# Patient Record
Sex: Male | Born: 1990 | Race: White | Hispanic: No | Marital: Single | Smoking: Current every day smoker
Health system: Southern US, Community
[De-identification: ages and names within clinical notes are randomized; demographics above are authoritative.]

## PROBLEM LIST (undated history)

## (undated) HISTORY — PX: TONSILLECTOMY: SUR1361

## (undated) HISTORY — PX: HAND SURGERY: SHX662

---

## 2016-12-06 ENCOUNTER — Emergency Department (INDEPENDENT_AMBULATORY_CARE_PROVIDER_SITE_OTHER): Payer: Self-pay

## 2016-12-06 ENCOUNTER — Encounter: Payer: Self-pay | Admitting: Emergency Medicine

## 2016-12-06 ENCOUNTER — Emergency Department (INDEPENDENT_AMBULATORY_CARE_PROVIDER_SITE_OTHER)
Admission: EM | Admit: 2016-12-06 | Discharge: 2016-12-06 | Disposition: A | Discharge status: Home / Self Care | Payer: Self-pay | Source: Home / Self Care | Attending: Family Medicine | Admitting: Family Medicine

## 2016-12-06 DIAGNOSIS — M79672 Pain in left foot: Secondary | ICD-10-CM

## 2016-12-06 DIAGNOSIS — S9032XA Contusion of left foot, initial encounter: Secondary | ICD-10-CM

## 2016-12-06 NOTE — ED Provider Notes (Signed)
[facility]    CSN: [id number] Arrival date & time: 12/06/16  1021     History   Chief Complaint Chief Complaint  Patient presents with  . Foot Injury    HPI Henry Jordan is a 26 y.o. male.   Patient bumped the lateral aspect of his left foot on a door jamb last night.  He has persistent pain this morning.   The history is provided by the patient.  Foot Injury  Location:  Foot Time since incident:  12 hours Injury: yes   Mechanism of injury comment:  Contusion Foot location:  L foot Pain details:    Quality:  Aching   Radiates to:  Does not radiate   Severity:  Mild   Onset quality:  Sudden   Duration:  12 hours   Timing:  Constant   Progression:  Unchanged Chronicity:  New Dislocation: no   Foreign body present:  No foreign bodies Prior injury to area:  No Relieved by:  None tried Worsened by:  Bearing weight Ineffective treatments:  NSAIDs Associated symptoms: stiffness and swelling   Associated symptoms: no decreased ROM, no numbness and no tingling   Risk factors: obesity     History reviewed. No pertinent past medical history.  There are no active problems to display for this patient.   Past Surgical History:  Procedure Laterality Date  . HAND SURGERY    . TONSILLECTOMY         Home Medications    Prior to Admission medications   Not on File    Family History History reviewed. No pertinent family history.  Social History Social History  Substance Use Topics  . Smoking status: Current Every Day Smoker  . Smokeless tobacco: Current User  . Alcohol use Yes     Allergies   Patient has no known allergies.   Review of Systems Review of Systems  Musculoskeletal: Positive for stiffness.  All other systems reviewed and are negative.    Physical Exam Triage Vital Signs ED Triage Vitals  Enc Vitals Group     BP 12/06/16 1103 118/71     Pulse Rate 12/06/16 1103 81     Resp --      Temp 12/06/16 1103 98.1 F (36.7  C)     Temp Source 12/06/16 1103 Oral     SpO2 12/06/16 1103 97 %     Weight 12/06/16 1104 (!) 325 lb (147.4 kg)     Height 12/06/16 1104  (1.753 m)     Head Circumference --      Peak Flow --      Pain Score 12/06/16 1104 5     Pain Loc --      Pain Edu? --      Excl. in GC? --    No data found.   Updated Vital Signs BP 118/71 (BP Location: Left Arm)   Pulse 81   Temp 98.1 F (36.7 C) (Oral)   Ht  (1.753 m)   Wt (!) 325 lb (147.4 kg)   SpO2 97%   BMI 47.99 kg/m   Visual Acuity Right Eye Distance:   Left Eye Distance:   Bilateral Distance:    Right Eye Near:   Left Eye Near:    Bilateral Near:     Physical Exam  Constitutional: He appears well-developed and well-nourished. No distress.  HENT:  Head: Atraumatic.  Eyes: Pupils are equal, round, and reactive to light.  Cardiovascular: Normal rate.  Pulmonary/Chest: Effort normal.  Musculoskeletal:       Left foot: There is tenderness, bony tenderness and swelling. There is normal range of motion, normal capillary refill, no crepitus, no deformity and no laceration.       Feet:  There is tenderness to palpation over the lateral aspect of the left foot (MTP joint and metatarsal) with mild swelling and ecchymosis over plantar surface.  All toes have full range of motion.  Distal neurovascular function is intact.   Neurological: He is alert.  Skin: Skin is warm and dry.  Nursing note and vitals reviewed.    UC Treatments / Results  Labs (all labs ordered are listed, but only abnormal results are displayed) Labs Reviewed - No data to display  EKG  EKG Interpretation None       Radiology Dg Foot Complete Left  Result Date: 12/06/2016 CLINICAL DATA:  Hit foot on door frame last night with pain laterally EXAM: LEFT FOOT - COMPLETE 3+ VIEW COMPARISON:  None. FINDINGS: Tarsal -metatarsal alignment is normal. No fracture is seen. Joint spaces appear normal. IMPRESSION: Negative. Electronically Signed    By: Dwyane Dee M.D.   On: 12/06/2016 11:10    Procedures Procedures (including critical care time)  Medications Ordered in UC Medications - No data to display   Initial Impression / Assessment and Plan / UC Course  I have reviewed the triage vital signs and the nursing notes.  Pertinent labs & imaging results that were available during my care of the patient were reviewed by me and considered in my medical decision making (see chart for details).    Ace wrap applied. Wear ace wrap until swelling resolves.  Apply ice pack for 20 to 30 minutes, 3 to 4 times daily  Continue until pain and swelling decrease.   Elevate foot.  May take Ibuprofen , 4 tabs every 8 hours with food.  Followup with Dr. Rodney Langton or Dr. Clementeen [facility] ([facility]) if not improving about two weeks.     Final Clinical Impressions(s) / UC Diagnoses   Final diagnoses:  Contusion of left foot, initial encounter    New Prescriptions New Prescriptions   No medications on file     Lattie Haw, MD 12/06/16 1146

## 2016-12-06 NOTE — ED Triage Notes (Signed)
Left foot injury yesterday, "stumped my toe", Top of foot hurts

## 2016-12-06 NOTE — Discharge Instructions (Signed)
Wear ace wrap until swelling resolves.  Apply ice pack for 20 to 30 minutes, 3 to 4 times daily  Continue until pain and swelling decrease.   Elevate foot.  May take Ibuprofen , 4 tabs every 8 hours with food.

## 2018-09-27 IMAGING — DX DG FOOT COMPLETE 3+V*L*
3 series · 3 of 3 positions shown · non-contrast
Comparison: None.

CLINICAL DATA: Hit foot on door frame last night with pain
laterally

EXAM:
LEFT FOOT - COMPLETE 3+ VIEW

[foot ap]
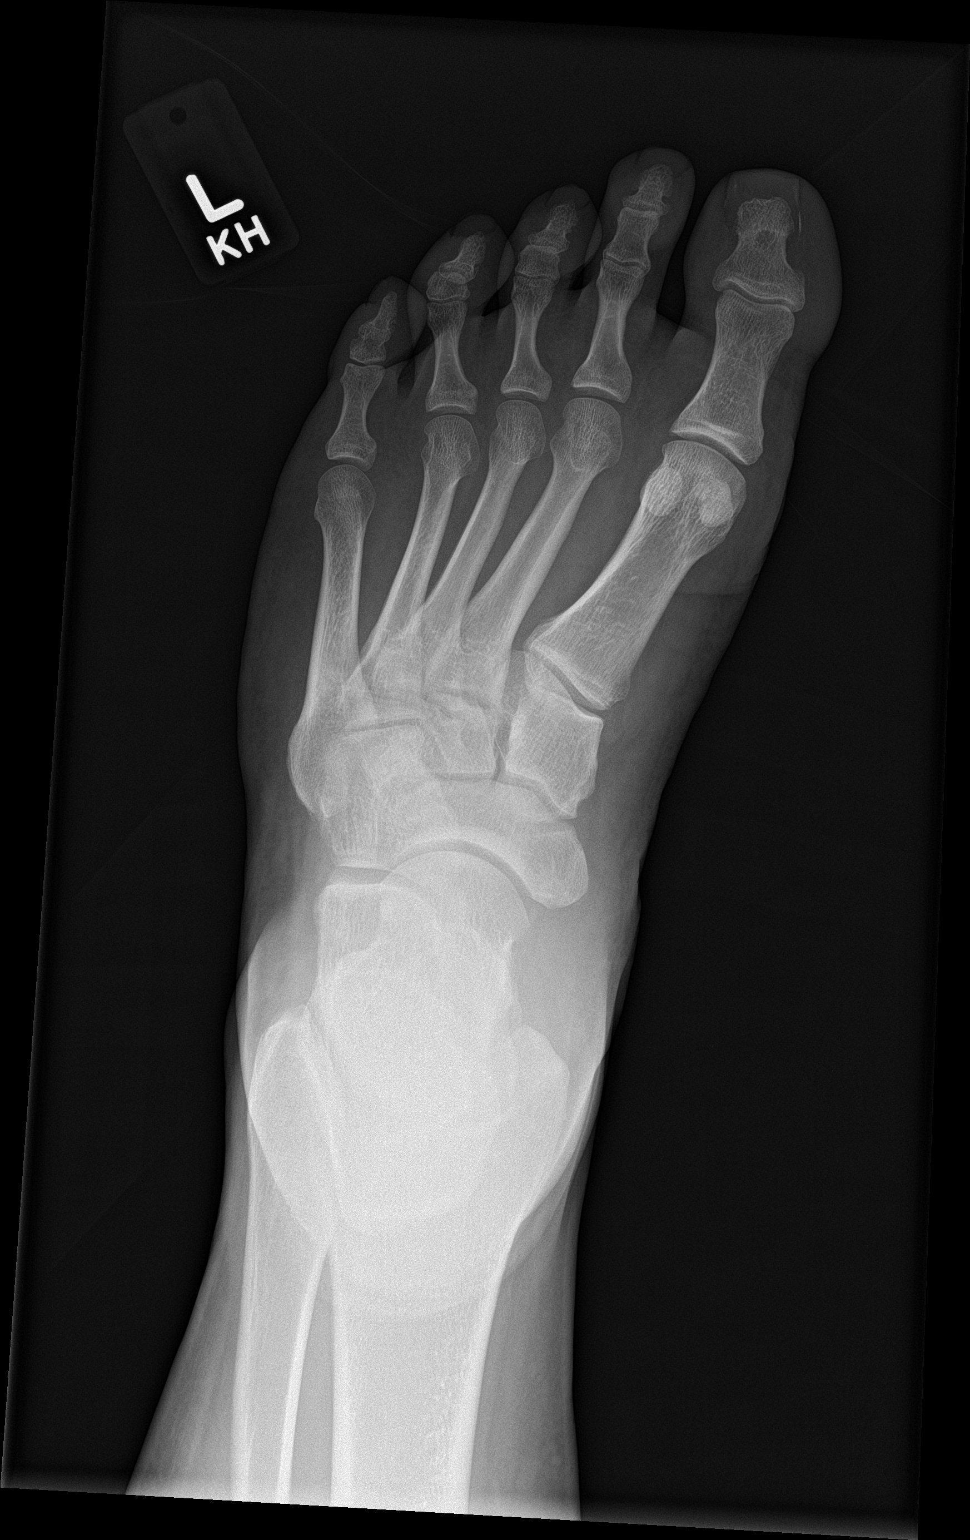

[foot obl]
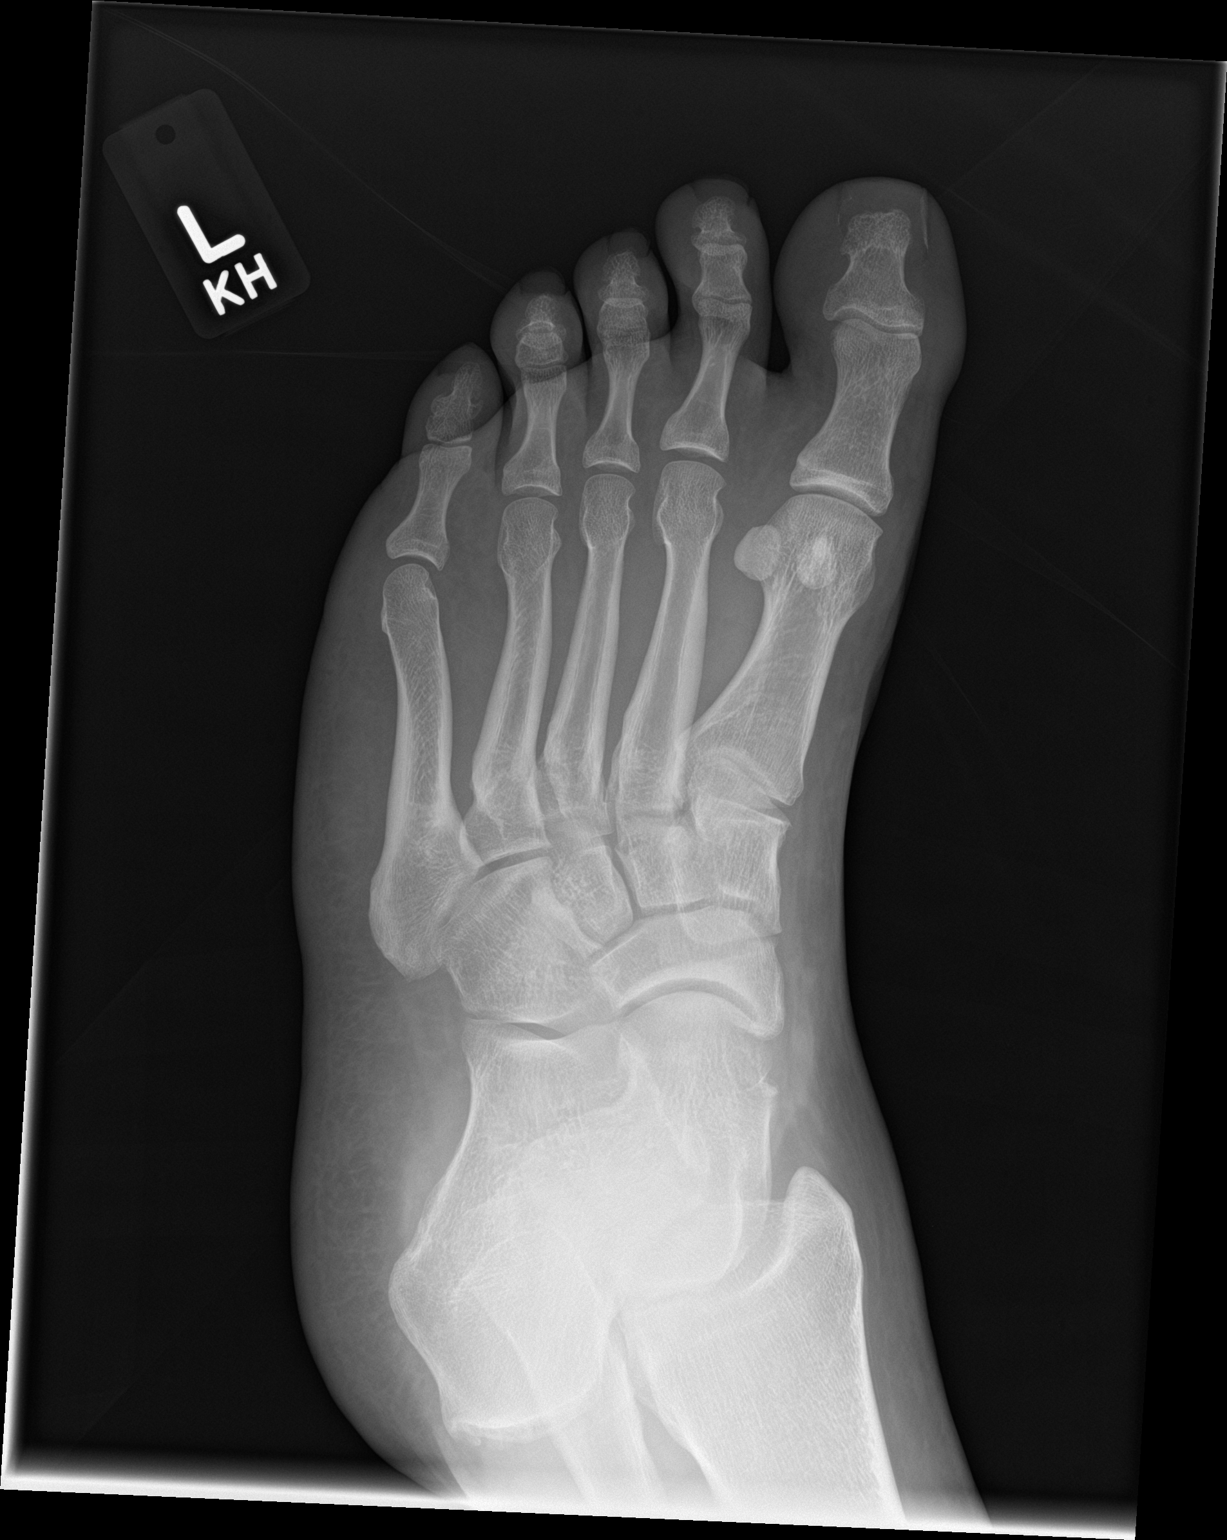

[foot lat]
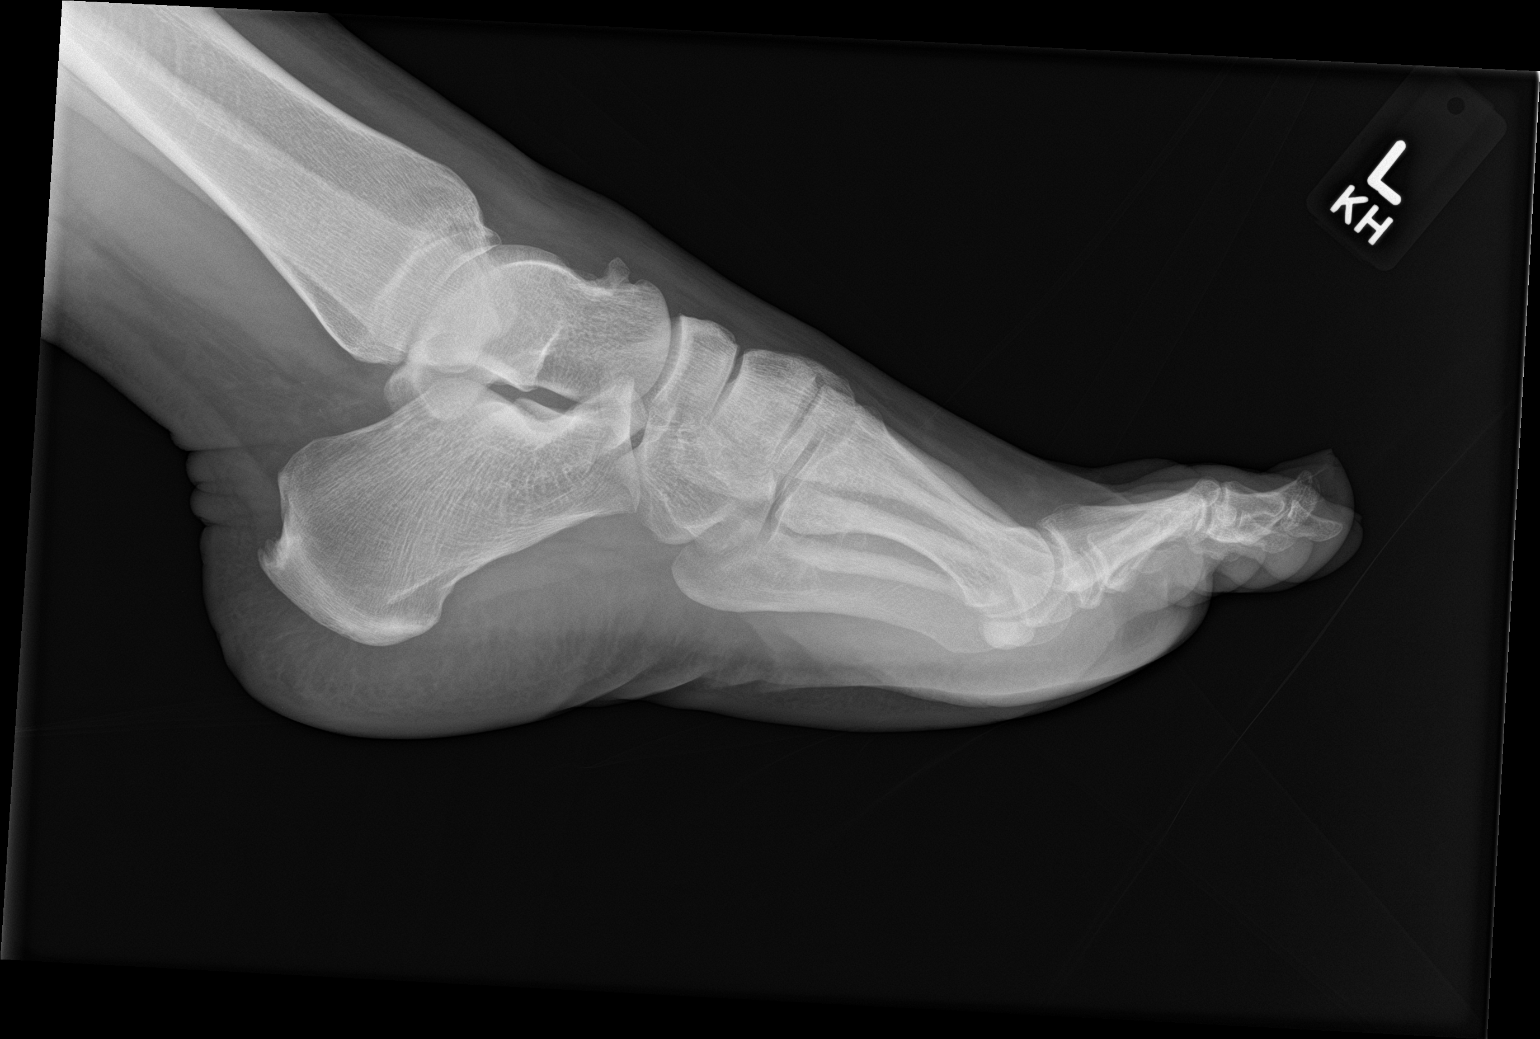

[3 of 3 positions shown; findings below may reference images not displayed]

FINDINGS: Tarsal -metatarsal alignment is normal. No fracture is seen. Joint
spaces appear normal.
IMPRESSION: Negative.
